# Patient Record
Sex: Male | Born: 1970 | Hispanic: No | Smoking: Never smoker
Health system: Southern US, Community
[De-identification: ages and names within clinical notes are randomized; demographics above are authoritative.]

---

## 2001-05-18 ENCOUNTER — Emergency Department (HOSPITAL_COMMUNITY): Admission: EM | Admit: 2001-05-18 | Discharge: 2001-05-18 | Payer: Self-pay | Admitting: Emergency Medicine

## 2013-09-12 ENCOUNTER — Encounter (HOSPITAL_COMMUNITY): Payer: Self-pay | Admitting: Emergency Medicine

## 2013-09-12 ENCOUNTER — Emergency Department (HOSPITAL_COMMUNITY)
Admission: EM | Admit: 2013-09-12 | Discharge: 2013-09-12 | Disposition: A | Discharge status: Home / Self Care | Payer: Self-pay | Attending: Emergency Medicine | Admitting: Emergency Medicine

## 2013-09-12 DIAGNOSIS — Y929 Unspecified place or not applicable: Secondary | ICD-10-CM | POA: Insufficient documentation

## 2013-09-12 DIAGNOSIS — Y9389 Activity, other specified: Secondary | ICD-10-CM | POA: Insufficient documentation

## 2013-09-12 DIAGNOSIS — T1590XA Foreign body on external eye, part unspecified, unspecified eye, initial encounter: Secondary | ICD-10-CM | POA: Insufficient documentation

## 2013-09-12 DIAGNOSIS — S0590XA Unspecified injury of unspecified eye and orbit, initial encounter: Secondary | ICD-10-CM

## 2013-09-12 DIAGNOSIS — S0510XA Contusion of eyeball and orbital tissues, unspecified eye, initial encounter: Secondary | ICD-10-CM | POA: Insufficient documentation

## 2013-09-12 MED ORDER — TETRACAINE HCL 0.5 % OP SOLN
1.0000 [drp] | Freq: Once | OPHTHALMIC | Status: AC
  Administered 2013-09-12: 1 [drp] via OPHTHALMIC
  Filled 2013-09-12: qty 2

## 2013-09-12 MED ORDER — ERYTHROMYCIN 5 MG/GM OP OINT
TOPICAL_OINTMENT | Freq: Once | OPHTHALMIC | Status: AC
  Administered 2013-09-12: 1 via OPHTHALMIC
  Filled 2013-09-12: qty 3.5

## 2013-09-12 NOTE — ED Notes (Addendum)
Pt reports cleaning with a metal scraper and the patient feels like something flew into his right eye. Pt reports flushing his eye with water, which assisted with removal of some of the debris, however he is concerned regarding the possibility of retained debris. Pt is A/O x4, in NAD, and vitals are WDL.

## 2013-09-12 NOTE — ED Provider Notes (Signed)
CSN: [id number]     Arrival date & time 09/12/13  1233 History  This chart was scribed for non-physician practitioner, Ronald LowerVrinda Ashvin Adelson, PA-C,working with Ronald HornJohn M Bednar, MD, by Ronald Webb, ED Scribe.  This patient was seen in room WTR7/WTR7 and the patient's care was started at 12:52 PM.  Chief Complaint  Patient presents with  . Eye Injury   The history is provided by the patient. No language interpreter was used.   HPI Comments:  Ronald Webb is a 43 y.o. male who presents to the Emergency Department complaining of a foreign body in his right eye that happened approximately one hour ago. He reports he was shaving something metal and got dirt and metal shavings in his eye. He reports flushing his eye with water and instilling saline drops with mild relief. He reports feeling as if something may still be in there. He denies fever, photophobia, or visual disturbance. He denies wearing contact lenses. He denies allergies to any medication.   History reviewed. No pertinent past medical history. History reviewed. No pertinent past surgical history. No family history on file. History  Substance Use Topics  . Smoking status: Never Smoker   . Smokeless tobacco: Never Used  . Alcohol Use: Yes     Comment: Socailly     Review of Systems  Constitutional: Negative for fever.  Eyes: Positive for pain. Negative for photophobia and visual disturbance.  All other systems reviewed and are negative.   Allergies  Review of patient's allergies indicates no known allergies.  Home Medications  No current outpatient prescriptions on file. Triage Vitals: BP 147/86  Pulse 86  Temp(Src) 98.2 F (36.8 C) (Oral)  Resp 18  SpO2 99% Physical Exam  Nursing note and vitals reviewed. Constitutional: He is oriented to person, place, and time. He appears well-developed and well-nourished.  HENT:  Head: Normocephalic and atraumatic.  Eyes: Conjunctivae and EOM are normal. Pupils are equal, round, and  reactive to light. No foreign body present in the right eye. Right conjunctiva is not injected.  Slit lamp exam:      The right eye shows no fluorescein uptake.  Neck: Normal range of motion.  Cardiovascular: Normal rate.   Pulmonary/Chest: Effort normal.  Musculoskeletal: Normal range of motion.  Neurological: He is alert and oriented to person, place, and time.  Skin: Skin is warm and dry.  Psychiatric: He has a normal mood and affect. His behavior is normal.    ED Course  Procedures (including critical care time) DIAGNOSTIC STUDIES: Oxygen Saturation is 99% on RA, normal by my interpretation.   COORDINATION OF CARE: 12:57 PM- Will instill tetracaine drops and fluorescein dye and check for abrasion. Pt verbalizes understanding and agrees to plan.  Medications  tetracaine (PONTOCAINE) 0.5 % ophthalmic solution 1 drop (not administered)   Labs Review Labs Reviewed - No data to display Imaging Review No results found.   EKG Interpretation None      MDM   Final diagnoses:  Eye injury    No fb noted pt used qtip and flushing at home;likely continued irritation. Pt given optho follow up as needed  I personally performed the services described in this documentation, which was scribed in my presence. The recorded information has been reviewed and is accurate.    Ronald LowerVrinda Jahnyla Parrillo, NP 09/12/13 1312

## 2013-09-12 NOTE — ED Provider Notes (Signed)
Medical screening examination/treatment/procedure(s) were performed by non-physician practitioner and as supervising physician I was immediately available for consultation/collaboration.    Hurman HornJohn M Iann Rodier, MD 09/12/13 2115

## 2016-10-04 ENCOUNTER — Ambulatory Visit (INDEPENDENT_AMBULATORY_CARE_PROVIDER_SITE_OTHER): Payer: BLUE CROSS/BLUE SHIELD

## 2016-10-04 ENCOUNTER — Ambulatory Visit (INDEPENDENT_AMBULATORY_CARE_PROVIDER_SITE_OTHER): Payer: BLUE CROSS/BLUE SHIELD | Admitting: Physician Assistant

## 2016-10-04 VITALS — BP 120/76 | HR 91 | Temp 98.5°F | Resp 18 | Ht 69.5 in | Wt 197.4 lb

## 2016-10-04 DIAGNOSIS — S43102A Unspecified dislocation of left acromioclavicular joint, initial encounter: Secondary | ICD-10-CM | POA: Diagnosis not present

## 2016-10-04 DIAGNOSIS — M25512 Pain in left shoulder: Secondary | ICD-10-CM

## 2016-10-04 MED ORDER — NAPROXEN 500 MG PO TABS: 500.0000 mg | ORAL_TABLET | Freq: Two times a day (BID) | ORAL | 0 refills | Status: DC

## 2016-10-04 NOTE — Patient Instructions (Addendum)
For your [facility] joint separation, I recommend avoiding all activity for the next two weeks. Keep your arm in the sling. Use ice to the affected area 4-5 x a day for 20 minutes at a time. Take naproxen every 12 hours as needed for pain. Follow up in 2 weeks for repeat xray. Thank you for letting me participate in your health and well being.    Acromioclavicular Separation Acromioclavicular separation is an injury to the small joint at the top of the shoulder (acromioclavicular joint or AC joint). The [facility] joint connects the outer tip of the collarbone (clavicle) to the top of the shoulder blade (acromion). Two strong cords of tissue (acromioclavicular ligament and coracoclavicular ligament) stretch across the [facility] joint to keep it in place. An AC joint separation happens when one or both ligaments stretch or tear, causing the joint to separate. There are six types of separation. The type of separation that you have depends on how much the ligaments are damaged and how far the joint has moved out of place. The less severe types of separation are most common. What are the causes? Common causes of this condition include:  A hard, direct hit (blow) to the top of the shoulder.  Falling on the shoulder.  Falling on an outstretched arm. What increases the risk? This condition is more likely to develop in male athletes under age 73 who participate in sports that involve potential contact, such as:  Football.  Rugby.  Hockey.  Cycling.  Martial arts. What are the signs or symptoms?   The main symptom of this condition is shoulder pain. Pain may be mild or severe, depending on the type of separation. Other signs and symptoms in the shoulder may include:  Swelling.  Limited range of motion, especially when moving the arm across the body.  Pain and tenderness when touching the top of the shoulder.  Pain when putting weight on the shoulder, such as rolling on the shoulder while sleeping.  A visible  bump (deformity) over the joint.  A clicking or popping sound when moving the shoulder. How is this diagnosed? This condition may be diagnosed based on:  Your symptoms.  Your medical history, including your history of recent injuries.  A physical exam to check for deformity and limited range of motion.  Imaging tests, such as:  X-rays.  MRI.  Ultrasound. How is this treated? Treatment for this condition may include:  Resting the shoulder before gradually returning to normal activities.  Icing the shoulder.  NSAIDs to help reduce pain and swelling.  A sling to support your shoulder and keep it from moving.  Physical therapy.  Surgery. This is rare. Surgery may be needed for severe injuries that include breaks (fractures) in a bone, or injuries that do not get better with nonsurgical treatments. Surgery is followed by keeping your joint in place for a period of time (immobilization) and physical therapy. Follow these instructions at home: If you have a sling:   Wear the sling as told by your health care provider. Remove it only as told by your health care provider.  Reposition the sling if your fingers tingle, become numb, or turn cold and blue.  Do not let your sling get wet if it is not waterproof. Ask your health care provider if you can remove the sling for bathing and showering.  Keep the sling clean. Managing pain, stiffness, and swelling    If directed, apply ice to the injured area.  Put ice in  a plastic bag.  Place a towel between your skin and the bag.  Leave the ice on for 20 minutes, 2-3 times a day.  Move your fingers often to avoid stiffness and to lessen swelling. Driving   Do not drive or operate heavy machinery while taking prescription pain medicine.  Ask your health care provider when it is safe for you to drive. Activity   Rest and return to your normal activities as told by your health care provider. Ask your health care provider what  activities are safe for you.  Do exercises as told by your health care provider. General instructions   Do not use any tobacco products, such as cigarettes, chewing tobacco, and e-cigarettes. Tobacco can delay healing. If you need help quitting, ask your health care provider.  Take over-the-counter and prescription medicines only as told by your health care provider.  Keep all follow-up visits as told by your health care provider. This is important. How is this prevented?  Make sure to use equipment that fits you.  Wear shoulder padding during contact sports.  Be safe and responsible while being active to avoid falls. Contact a health care provider if:  Your pain and stiffness do not improve after 2 weeks. This information is not intended to replace advice given to you by your health care provider. Make sure you discuss any questions you have with your health care provider. Document Released: 05/28/2005 Document Revised: 02/02/2016 Document Reviewed: 04/30/2015 Elsevier Interactive Patient Education  2017 [employer].  IF you received an x-ray today, you will receive an invoice from [facility]. Please contact [facility] at [phone] with questions or concerns regarding your invoice.   IF you received labwork today, you will receive an invoice from [facility]. Please contact [facility] at [phone] with questions or concerns regarding your invoice.   Our billing staff will not be able to assist you with questions regarding bills from these companies.  You will be contacted with the lab results as soon as they are available. The fastest way to get your results is to activate your My Chart account. Instructions are located on the last page of this paperwork. If you have not heard from [place] regarding the results in 2 weeks, please contact this office.

## 2016-10-04 NOTE — Progress Notes (Signed)
Ronald Webb is a 46 y.o. male  Shoulder Pain: Patient complaints of left shoulder pain. This is evaluated as a personal injury. The pain is described as sharp.  The onset of the pain occurred 1 day ago. The day prior to noticining hte pain he had worked out shoulders at [employer] He works out regularly, was lifting normal weights.  The pain occurs continuously.  Location is acromioclavicular joint. No history of dislocation. Symptoms are aggravated by lifting, pulling, repetitive use, difficulty sleeping on affected side. Symptoms are diminisohed by  rest, ice, medication: Ibuprofen used and beneficial. No stiffness, no weakness, no swelling is reported. 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   No past medical history on file. No past surgical history on file. No current outpatient prescriptions on file. No Known Allergies  reports that he has never smoked. He has never used smokeless tobacco. He reports that he drinks alcohol. He reports that he does not use drugs. No family history on file.  Shoulder: Inspection reveals no abnormalities, atrophy or asymmetry. Palpation is normal with no tenderness over AC joint or bicipital groove. ROM is full in all planes. Rotator cuff strength normal throughout. No signs of impingement with negative Neer and Hawkin's tests, empty can. Speeds and Yergason's tests normal. No labral pathology noted with negative Obrien's, negative clunk and good stability. Normal scapular function observed. No painful arc and no drop arm sign. No apprehension sign.

## 2016-10-04 NOTE — Progress Notes (Signed)
Ronald Webb  MRN: [id number] DOB: [birth year]  Subjective:  Ronald Webb is a 46 y.o. male seen in office today for a chief complaint of shoulder pain. Patient complaints of left shoulder pain. This is evaluated as a personal injury. The pain is described as sharp.  The onset of the pain occurred 1 day ago. The day prior to noticining hte pain he had worked out shoulders at [employer] He works out regularly, was lifting normal weights.  The pain occurs continuously.  Location is acromioclavicular joint. No history of dislocation. Symptoms are aggravated by lifting, pulling, repetitive use, difficulty sleeping on affected side. Symptoms are diminisohed by  rest, ice, medication: Ibuprofen used and beneficial. No stiffness, no weakness, no swelling is reported. 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  Review of Systems  There are no active problems to display for this patient.   No current outpatient prescriptions on file prior to visit.   No current facility-administered medications on file prior to visit.     No Known Allergies     Social History   Social History  . Marital status: Divorced    Spouse name: N/A  . Number of children: N/A  . Years of education: N/A   Occupational History  . Not on file.   Social History Main Topics  . Smoking status: Never Smoker  . Smokeless tobacco: Never Used  . Alcohol use Yes     Comment: Socailly   . Drug use: No  . Sexual activity: Not on file   Other Topics Concern  . Not on file   Social History Narrative  . No narrative on file    Objective:  BP 120/76 (BP Location: Right Arm, Patient Position: Sitting, Cuff Size: Large)   Pulse 91   Temp 98.5 F (36.9 C) (Oral)   Resp 18   Ht 5' 9.5" (1.765 m)   Wt 197 lb 6.4 oz (89.5 kg)   SpO2 99%   BMI 28.73 kg/m   Physical Exam  Constitutional: He is oriented to person, place, and time and well-developed, well-nourished, and in no distress.  HENT:  Head:  Normocephalic and atraumatic.  Eyes: Conjunctivae are normal.  Neck: Normal range of motion.  Pulmonary/Chest: Effort normal.  Musculoskeletal:       Left shoulder: He exhibits decreased range of motion, tenderness ( most notable at [facility] joint ) and swelling (mild in anterior aspect of shoulder). He exhibits no crepitus, no spasm and normal pulse.  Limited left shoulder exam due to pain.   Neurological: He is alert and oriented to person, place, and time. He has normal sensation. Gait normal.  Skin: Skin is warm and dry.  Psychiatric: Affect normal.  Vitals reviewed.  Dg Ac Joints  Result Date: 10/04/2016 CLINICAL DATA:  Left AC joint pain, initial encounter EXAM: LEFT ACROMIOCLAVICULAR JOINTS COMPARISON:  None. 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 Minimal separation is noted were the right acromioclavicular joint with weights. No significant displacement is noted on the left. Calcific tendinitis of the supraspinatus tendon is noted on the left as well. No other focal abnormality is seen. IMPRESSION: Degenerative changes as described. Mild right AC joint separation is noted with weights. Electronically Signed   By: Ronald Webb M.D.   On: 10/04/2016 16:00   Dg Shoulder Left  Result Date: 10/04/2016 CLINICAL DATA:  Acromioclavicular joint pain, initial encounter EXAM: LEFT SHOULDER - 2+ VIEW COMPARISON:  None.  FINDINGS: Mild degenerative changes of the acromioclavicular joint are seen. No fracture or dislocation is noted. Mild supraspinatus calcific tendonitis is noted. No other soft tissue abnormality is seen. IMPRESSION: Chronic changes without acute abnormality. Electronically Signed   By: Ronald Webb M.D.   On: 10/04/2016 16:01    Assessment and Plan :  1. Acute pain of left shoulder - DG AC Joints; Future - DG Shoulder Left; Future  2. Separation of left acromioclavicular joint, initial encounter Given educational material on [facility]  joint separation. Shoulder sling applied in office. Instructed to rest, ice, and take naproxen as needed for pain over the next two weeks. Avoid all lifting/activity with left arm. Follow up in 2 weeks for repeat imaging and referral to PT.  - Apply other splint - Sling immobilizer - naproxen (NAPROSYN) 500 MG tablet; Take 1 tablet (500 mg total) by mouth 2 (two) times daily with a meal.  Dispense: 30 tablet; Refill: 0  Ronald Core PA-C  [facility] 10/04/2016 4:08 PM

## 2016-10-24 ENCOUNTER — Encounter: Payer: Self-pay | Admitting: Physician Assistant

## 2016-10-24 ENCOUNTER — Ambulatory Visit (INDEPENDENT_AMBULATORY_CARE_PROVIDER_SITE_OTHER): Payer: BLUE CROSS/BLUE SHIELD | Admitting: Physician Assistant

## 2016-10-24 VITALS — BP 128/82 | HR 65 | Temp 97.8°F | Resp 18 | Ht 68.7 in | Wt 192.6 lb

## 2016-10-24 DIAGNOSIS — S4350XD Sprain of unspecified acromioclavicular joint, subsequent encounter: Secondary | ICD-10-CM | POA: Diagnosis not present

## 2016-10-24 NOTE — Progress Notes (Signed)
    MRN: [id number] DOB: 09-03-1970  Subjective:   Ronald Webb is a 46 y.o. male presenting [facility]. Pt initially seen by me on 10/04/16 for acute shoulder pain. AC joint plain film views showed degenerative changes and mild AC joint separation. Pt was given a sling and naproxen. Instructed to ice affected area and to avoid all lifting and activity with left arm for a few days and then begin exercises as tolerated.  Today, pt notes he feels completely better. States the pain resolved about three days after his injury in the gym. He wore the sling for three days and took naproxen for the first three days. Has iced minimally. He has returned back to the gym a few days ago and notes he took it easy and only noticed a mild pain at [facility]. Denies numbness, tingling, weakness, and loss of ROM.   [facility] has a current medication list which includes the following prescription(s): naproxen. Also has No Known Allergies.  Syed  has no past medical history on file. Also  has no past surgical history on file.   Objective:   Vitals: There were no vitals taken for this visit.  Physical Exam  Constitutional: He is oriented to person, place, and time. He appears well-developed and well-nourished. No distress.  HENT:  Head: Normocephalic and atraumatic.  Eyes: Conjunctivae are normal.  Neck: Normal range of motion.  Pulmonary/Chest: Effort normal.  Musculoskeletal:       Right shoulder: Normal.       Left shoulder: He exhibits crepitus (mild). He exhibits normal range of motion, no tenderness, no bony tenderness, no swelling, no pain and normal strength.  Neurological: He is alert and oriented to person, place, and time.  Skin: Skin is warm and dry.  Psychiatric: He has a normal mood and affect.  Vitals reviewed.   No results found for this or any previous visit (from the past 24 hour(s)).  Assessment and Plan :  1. Sprain of acromioclavicular ligament, unspecified  laterality, subsequent encounter Improved. Pt given educational material on exercises to begin daily. Advance exercises as tolerated. Continue ice/NSAIDs as needed. Return to clinic if symptoms worsen, do not improve, or as needed  Benjiman CoreBrittany Pauline Trainer, PA-C  [facility] 10/24/2016 5:08 PM

## 2016-10-24 NOTE — Patient Instructions (Addendum)
I am glad to see you are doing better. Please start doing activity as tolerated. Below are some exercises that I recommend doing daily for the next 5-7 days before trying to return back to the gym. Once you go back to the gym,please start with light weight. Do not jump into using the weight you typically use. Advance weight as tolerated. If any particular exercise or weight causes pain, stop that exercise. Make sure you continue to ice the days you go to the gym. Please let me know if pain returns, if so we could always send you to physical therapy. Otherwise, take care! Thank you for letting me participate in your health and well being.   Acromioclavicular Separation Rehab After Surgery Ask your health care provider which exercises are safe for you. Do exercises exactly as told by your health care provider and adjust them as directed. It is normal to feel mild stretching, pulling, tightness, or discomfort as you do these exercises, but you should stop right away if you feel sudden pain or your pain gets worse. Do not begin these exercises until told by your health care provider. Stretching and range of motion exercises These exercises warm up your muscles and joints and improve the movement and flexibility of your shoulder. These exercises also help to relieve pain, numbness, and tingling. Exercise A: Pendulum   1. Stand near a wall or a surface that you can hold onto for balance. 2. Bend at the waist and let your left / right arm hang straight down. Use your other arm to keep your balance. 3. Relax your arm and shoulder muscles, and move your hips and your trunk so your left / right arm swings freely. Your arm should swing because of the motion of your body, not because you are using your arm or shoulder muscles. 4. Keep moving so your arm swings in the following directions, as told by your health care provider:  Side to side.  Forward and backward.  In clockwise and counterclockwise  circles. Repeat __________ times, or for __________ seconds per direction. Complete this exercise __________ times a day. Exercise B: Flexion, standing   1. Stand and hold a broomstick, a cane, or a similar object with your hands. Place your hands a little more than shoulder-width apart on the object. Your left / right hand should be palm-up, and your other hand should be palm-down. 2. Push the stick down with your healthy arm to raise your left / right arm in front of your body, and then over your head. Use your other hand to help move the stick. Stop when you feel a stretch in your shoulder, or when you reach the angle that is recommended by your health care provider.  Avoid shrugging your shoulder while you raise your arm. Keep your shoulder blade tucked down toward your spine. 3. Hold for __________ seconds. 4. Slowly return to the starting position. Repeat __________ times. Complete this exercise __________ times a day. Exercise C: Flexion, seated   1. Sit in a stable chair so your left / right forearm can rest on a flat surface. Your elbow should rest at a height that keeps your upper arm next to your body. 2. Keeping your shoulder relaxed, lean forward at the waist and let your hand slide forward. Stop when you feel a stretch in your shoulder, or when you reach the angle that is recommended by your health care provider. 3. Hold for __________ seconds. 4. Slowly return to the starting  position. Repeat __________ times. Complete this exercise __________ times a day. Strengthening exercises These exercises build strength and endurance in your shoulder. Endurance is the ability to use your muscles for a long time, even after they get tired. Exercise D: Shoulder abduction, isometric   1. Stand or sit about 4-6 inches (10-15 cm) away from a wall with your right/left side facing the wall. 2. Bend your left / right elbow and gently press your elbow against the wall as if you are trying to move  your arm out to your side. Gradually increase the pressure until you are pressing as hard as you can without shrugging your shoulder. 3. Hold for __________ seconds. 4. Slowly release the tension and relax your muscles completely before you repeat the exercise. Repeat __________ times. Complete this exercise __________ times a day. Exercise E: Internal rotation, isometric   1. Stand or sit in a doorway, facing the door frame. 2. Bend your left / right elbow and place the palm of your hand against the door frame. Only your palm should be touching the frame. Keep your upper arm at your side. 3. Gently press your hand against the door frame, as if you are trying to push your arm toward your abdomen.  Avoid shrugging your shoulder while you press your hand into the door frame. Keep your shoulder blade tucked down toward the middle of your back. 4. Hold for __________ seconds. 5. Slowly release the tension, and relax your muscles completely before you repeat the exercise. Repeat __________ times. Complete this exercise __________ times a day. Exercise F: External rotation, isometric   1. Stand or sit in a doorway, facing the door frame. 2. Bend your left / right elbow and place the back of your wrist against the door frame. Only the back of your wrist should be touching the frame. Keep your upper arm at your side. 3. Gently press your wrist against the door frame, as if you are trying to push your arm away from your abdomen.  Avoid shrugging your shoulder while you press your wrist into the door frame. Keep your shoulder blade tucked down toward the middle of your back. 4. Hold for __________ seconds. 5. Slowly release the tension, and relax your muscles completely before you repeat the exercise. Repeat __________ times. Complete this exercise __________ times a day. Exercise G: Internal rotation   1. Sit in a stable chair without armrests, or stand. 2. Secure an exercise band at elbow height  at your left / right side. 3. Place a soft object, such as a folded towel or a small pillow, between your left / right upper arm and your body to move your elbow a few inches (about 10 cm) away from your side. 4. Hold the end of the exercise band so it stretches. 5. Keeping your elbow pressed against the soft object, move your forearm in, toward your abdomen. Keep your body steady so the movement is only coming from your shoulder. 6. Hold for __________ seconds. 7. Slowly return to the starting position. Repeat __________ times. Complete this exercise __________ times a day. Exercise H: External rotation   1. Sit in a stable chair without armrests, or stand. 2. Secure an exercise band at elbow height on your left / right side. 3. Place a soft object, such as a folded towel or a small pillow, between your left / right upper arm and your body to move your elbow a few inches (about 10 cm) away from your  side. 4. Hold the end of the exercise band so it stretches. 5. Keeping your elbow pressed against the soft object, move your forearm out, away from your abdomen. Keep your body steady so the movement is only coming from your shoulder. 6. Hold for __________ seconds. 7. Slowly return to the starting position. Repeat __________ times. Complete this exercise __________ times a day. This information is not intended to replace advice given to you by your health care provider. Make sure you discuss any questions you have with your health care provider. Document Released: 12/27/2004 Document Revised: 02/02/2016 Document Reviewed: 06/11/2015 Elsevier Interactive Patient Education  2017 [employer].    IF you received an x-ray today, you will receive an invoice from [facility]. Please contact [facility] at [phone] with questions or concerns regarding your invoice.   IF you received labwork today, you will receive an invoice from [facility]. Please contact [facility] at [phone]  with questions or concerns regarding your invoice.   Our billing staff will not be able to assist you with questions regarding bills from these companies.  You will be contacted with the lab results as soon as they are available. The fastest way to get your results is to activate your My Chart account. Instructions are located on the last page of this paperwork. If you have not heard from [place] regarding the results in 2 weeks, please contact this office.

## 2018-09-18 IMAGING — DX DG AC JOINTS*L*
2 series · 2 of 2 positions shown · non-contrast
Comparison: None.

CLINICAL DATA: Left AC joint pain, initial encounter

EXAM:
LEFT ACROMIOCLAVICULAR JOINTS

[humerus ap]
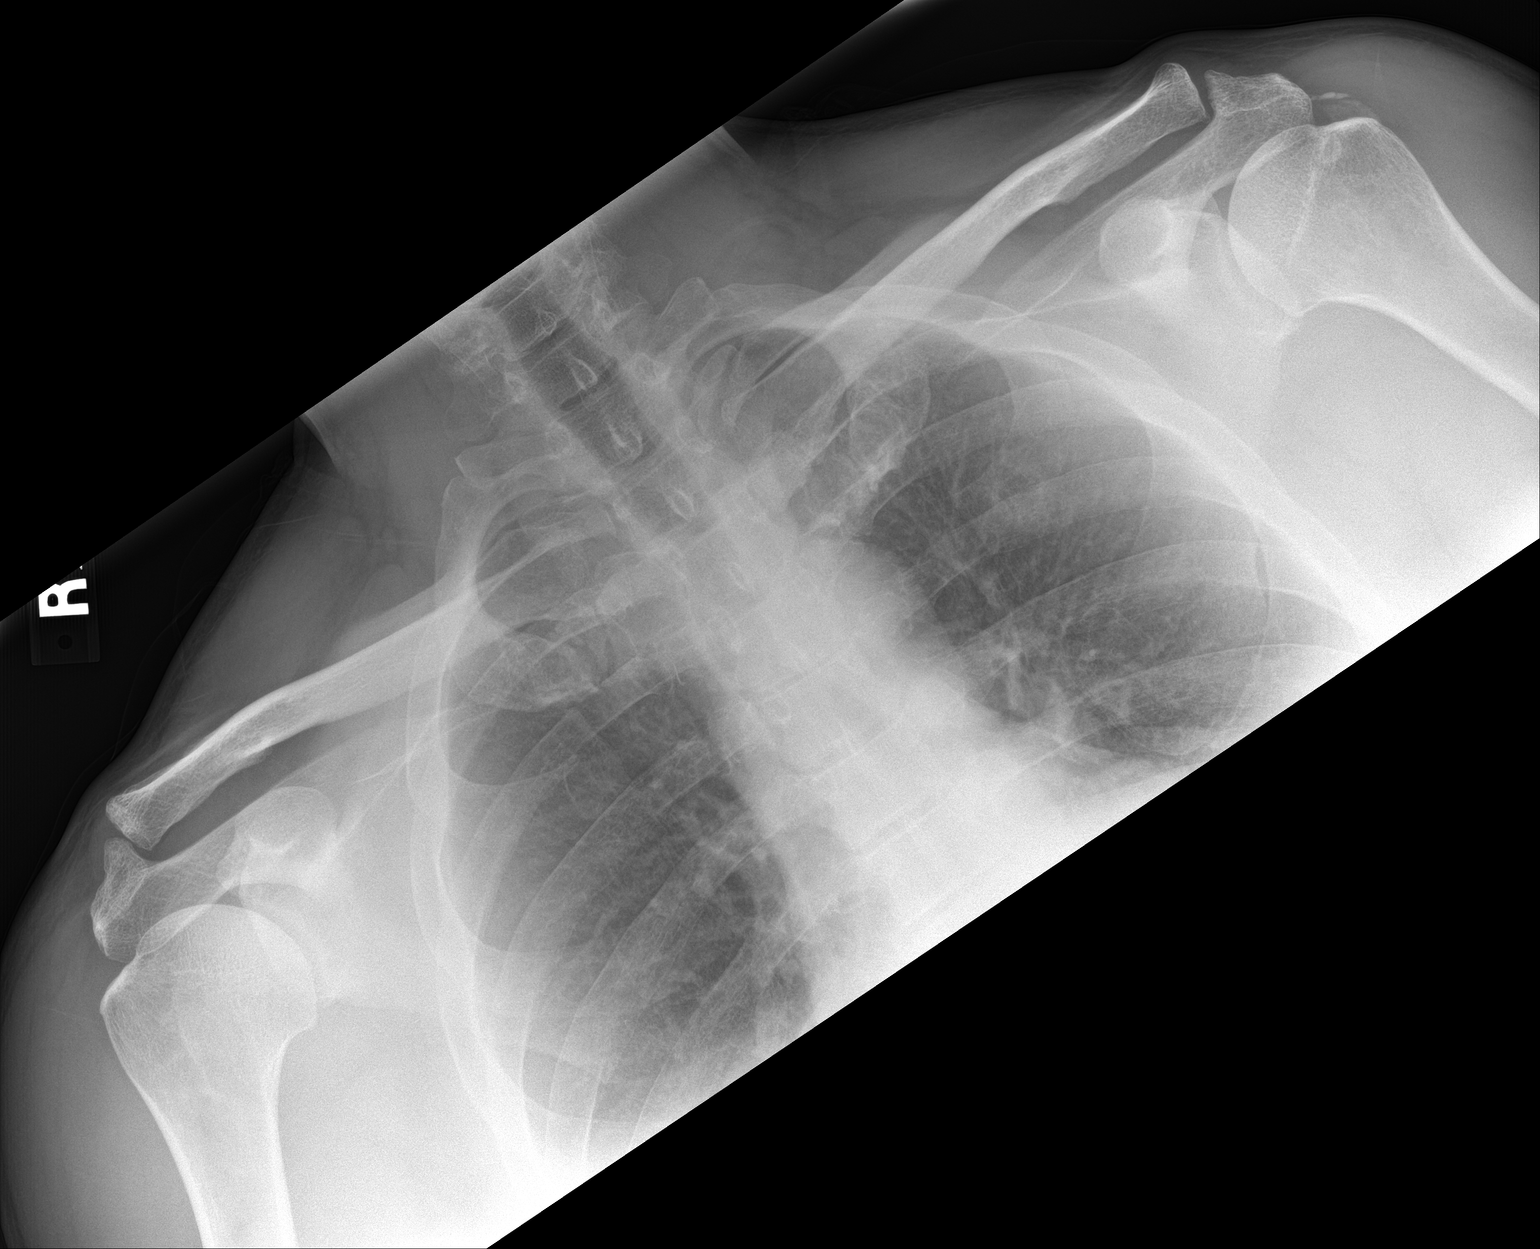

[humerus lat]
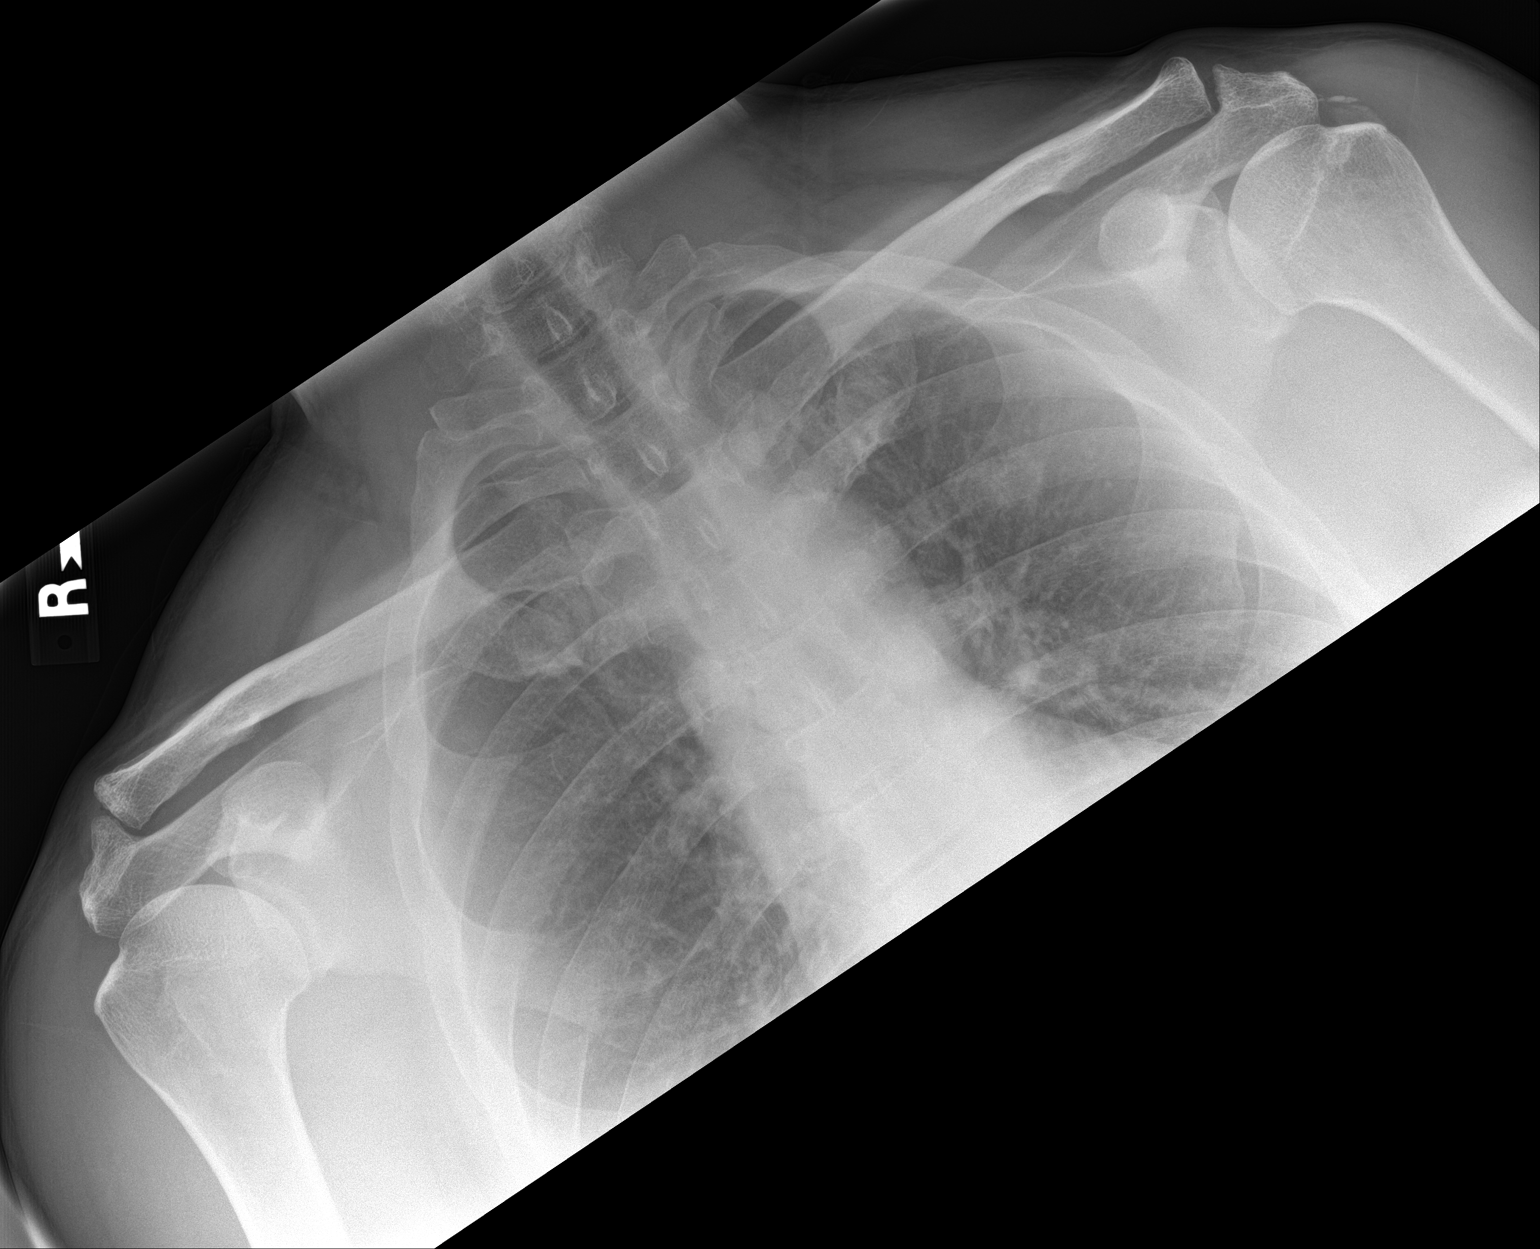

[2 of 2 positions shown; findings below may reference images not displayed]

FINDINGS: Mild degenerative changes of the acromioclavicular joints are noted
bilaterally. Minimal separation is noted were the right
acromioclavicular joint with weights. No significant displacement is
noted on the left. Calcific tendinitis of the supraspinatus tendon
is noted on the left as well. No other focal abnormality is seen.
IMPRESSION: Degenerative changes as described. Mild right AC joint separation is
noted with weights.

## 2018-09-18 IMAGING — DX DG SHOULDER 2+V*L*
3 series · 3 of 3 positions shown · non-contrast
Comparison: None.

CLINICAL DATA: Acromioclavicular joint pain, initial encounter

EXAM:
LEFT SHOULDER - 2+ VIEW

[shoulder ap]
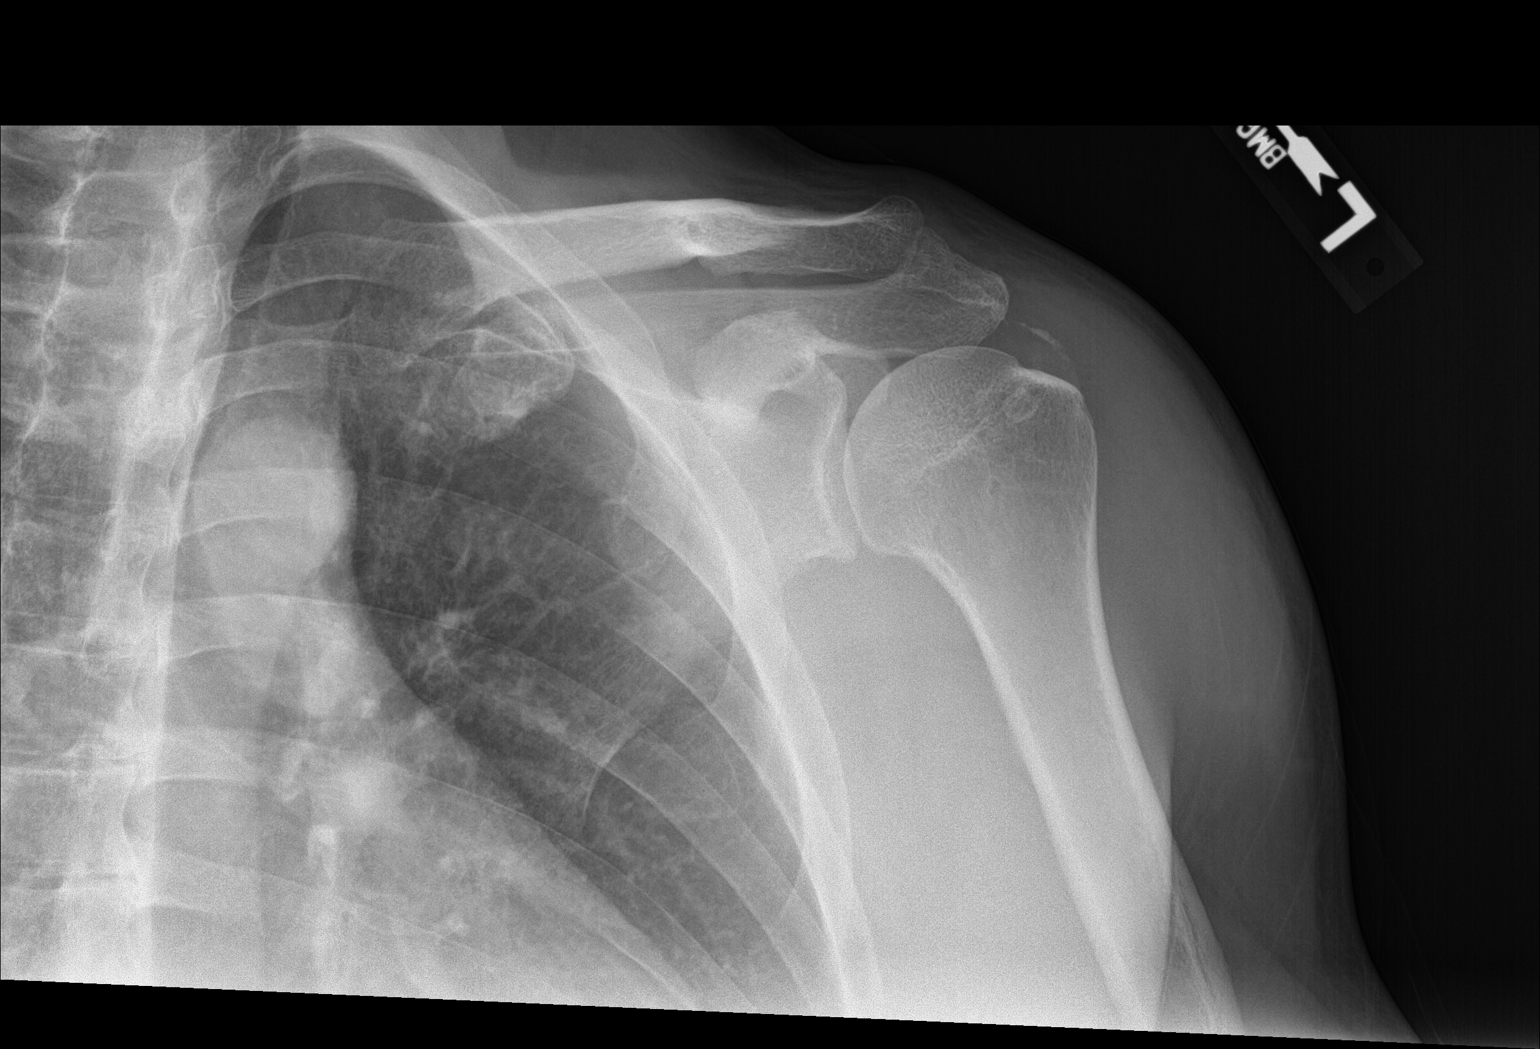

[shoulder y-view]
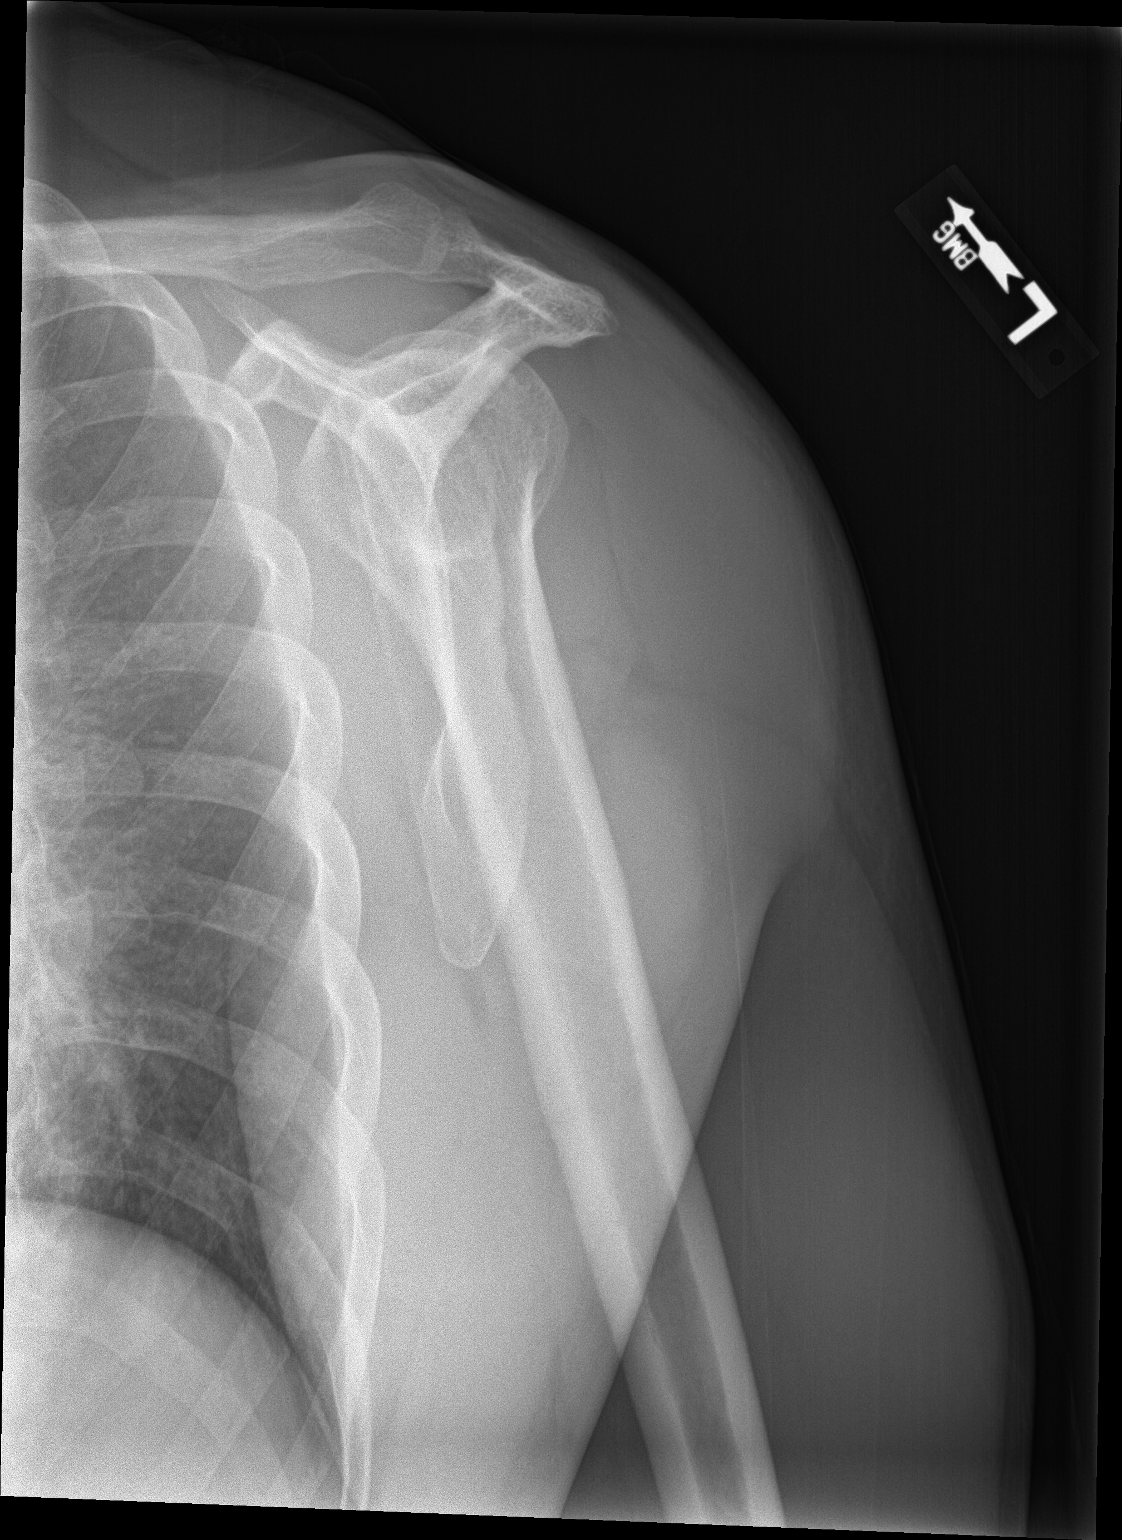

[shoulder axial]
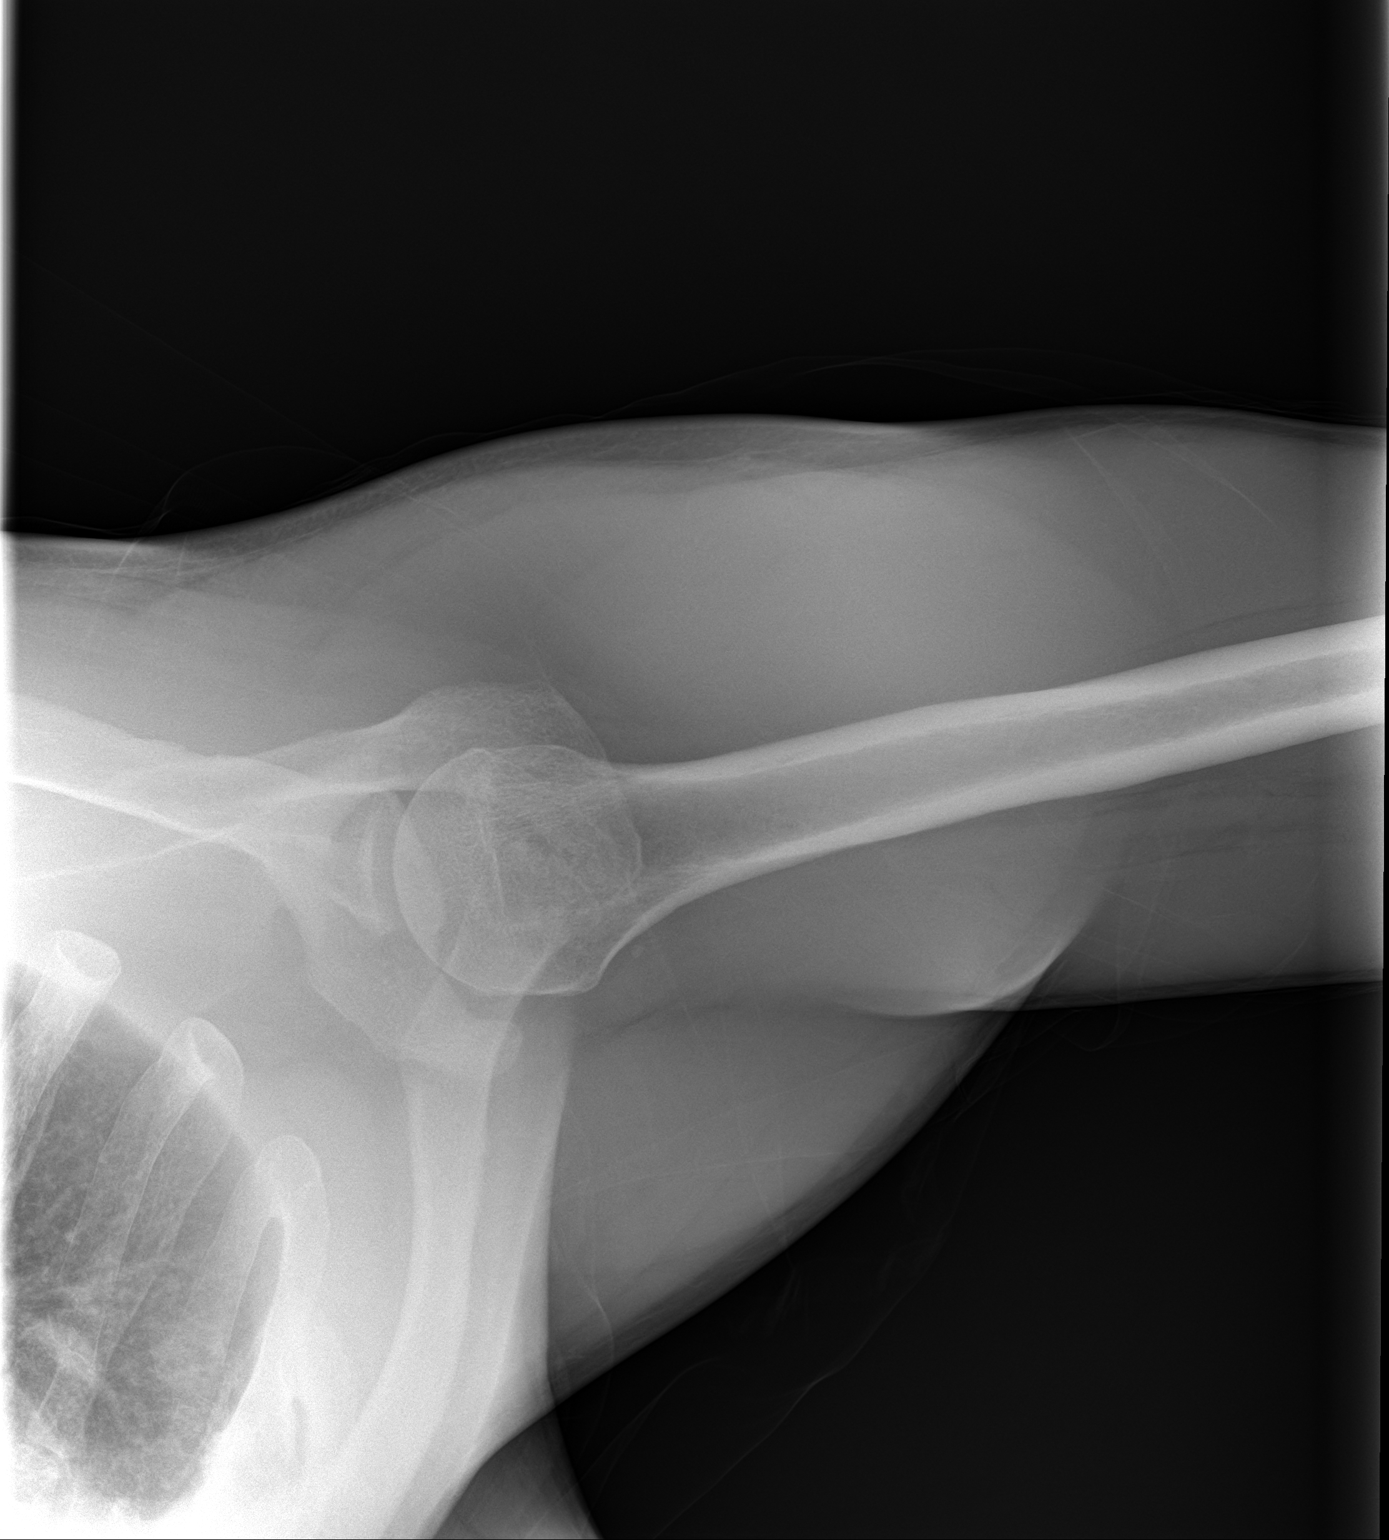

[3 of 3 positions shown; findings below may reference images not displayed]

FINDINGS: Mild degenerative changes of the acromioclavicular joint are seen.
No fracture or dislocation is noted. Mild supraspinatus calcific
tendonitis is noted. No other soft tissue abnormality is seen.
IMPRESSION: Chronic changes without acute abnormality.
